# Patient Record
Sex: Male | Born: 1975 | Race: White | Hispanic: No | Marital: Single | Smoking: Never smoker
Health system: Southern US, Community
[De-identification: ages and names within clinical notes are randomized; demographics above are authoritative.]

## PROBLEM LIST (undated history)

## (undated) DIAGNOSIS — I219 Acute myocardial infarction, unspecified: Secondary | ICD-10-CM

## (undated) DIAGNOSIS — J189 Pneumonia, unspecified organism: Secondary | ICD-10-CM

## (undated) DIAGNOSIS — Z9289 Personal history of other medical treatment: Secondary | ICD-10-CM

## (undated) DIAGNOSIS — M303 Mucocutaneous lymph node syndrome [Kawasaki]: Secondary | ICD-10-CM

## (undated) HISTORY — DX: Mucocutaneous lymph node syndrome (kawasaki): M30.3

## (undated) HISTORY — PX: CARDIAC CATHETERIZATION: SHX172

## (undated) HISTORY — PX: CORONARY ARTERY BYPASS GRAFT: SHX141

## (undated) HISTORY — PX: WISDOM TOOTH EXTRACTION: SHX21

---

## 2011-07-23 ENCOUNTER — Emergency Department: Payer: Self-pay | Admitting: Unknown Physician Specialty

## 2011-07-28 ENCOUNTER — Other Ambulatory Visit: Payer: Self-pay | Admitting: Orthopedic Surgery

## 2011-07-28 ENCOUNTER — Encounter (HOSPITAL_COMMUNITY): Payer: Self-pay | Admitting: Pharmacy Technician

## 2011-07-28 ENCOUNTER — Encounter (HOSPITAL_COMMUNITY)
Admission: RE | Admit: 2011-07-28 | Discharge: 2011-07-28 | Disposition: A | Discharge status: Home / Self Care | Payer: Worker's Compensation | Source: Ambulatory Visit | Attending: Orthopedic Surgery | Admitting: Orthopedic Surgery

## 2011-07-28 ENCOUNTER — Encounter (HOSPITAL_COMMUNITY): Payer: Self-pay

## 2011-07-28 ENCOUNTER — Encounter (HOSPITAL_COMMUNITY): Payer: Self-pay | Admitting: Vascular Surgery

## 2011-07-28 HISTORY — DX: Acute myocardial infarction, unspecified: I21.9

## 2011-07-28 HISTORY — DX: Pneumonia, unspecified organism: J18.9

## 2011-07-28 HISTORY — DX: Personal history of other medical treatment: Z92.89

## 2011-07-28 LAB — SURGICAL PCR SCREEN
MRSA, PCR: NEGATIVE
Staphylococcus aureus: POSITIVE — AB

## 2011-07-28 LAB — CBC
Hemoglobin: 19.1 g/dL — ABNORMAL HIGH (ref 13.0–17.0)
MCHC: 34.3 g/dL (ref 30.0–36.0)
Platelets: 247 10*3/uL (ref 150–400)
RDW: 14 % (ref 11.5–15.5)

## 2011-07-28 MED ORDER — CEFAZOLIN SODIUM-DEXTROSE 2-3 GM-% IV SOLR
2.0000 g | INTRAVENOUS | Status: DC
  Filled 2011-07-28: qty 50

## 2011-07-28 NOTE — Consult Note (Signed)
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  He has since developed compartment syndrome and is scheduled for left hand compartment releases on 07/29/11.  His PAT visit was on the afternoon of 07/28/11.  His Cardiologist is Dr. Merryl Hacker at [facility].  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  He has somewhat chronic chest pain, and subsequently had a cardiac cath on 06/11/11.  Patient feels at his baseline.  He reports that no new intervention or diagnostic study was recommended following his cardiac cath.  His last Cardiology office note or discharge summary has not been received yet, but [facility] did fax his recent cardiac cath report.  I only see mention of two bypass grafts (PDA and distal LAD) which were both noted to be patent.  EF 63%.  His EKG from [facility] is not dated, nor confirmed.  I reviewed his history and cath report with Anesthesiologist Dr. Jacklynn Bue.    CBC noted.  His H/H is 19.1/55.7. He is a non-smoker and is on ASA.  There are no comparison labs available. He will need a BMET and repeat EKG on arrival.  Shonna Chock, PA-C

## 2011-07-28 NOTE — Pre-Procedure Instructions (Signed)
18 Ricky Morrison  07/28/2011   Your procedure is scheduled on:  Wednesday, June 19th.  Report to [facility] at 7:00 AM.  Call this number if you have problems the morning of surgery: [phone]   Remember:   Do not eat food or drink any liquid:After Midnight.     Take these medicines the morning of surgery with A SIP OF WATER:    Do not wear jewelry, make-up or nail polish.  Do not wear lotions, powders, or perfumes. You may wear deodorant.  Do not shave 48 hours prior to surgery. Men may shave face and neck.  Do not bring valuables to the hospital.  Contacts, dentures or bridgework may not be worn into surgery.  Leave suitcase in the car. After surgery it may be brought to your room.  For patients admitted to the hospital, checkout time is 11:00 AM the day of discharge.   Patients discharged the day of surgery will not be allowed to drive home.  Name and phone number of your driver: _______________________  Special Instructions: CHG Shower Use Special Wash: 1/2 bottle night before surgery and 1/2 bottle morning of surgery.   Please read over the following fact sheets that you were given: Pain Booklet, Coughing and Deep Breathing, MRSA Information and Surgical Site Infection Prevention

## 2011-07-28 NOTE — Progress Notes (Signed)
After numerous attempts to fax request for information to [facility], I was able to speak to a nurse, Roxann.  Roxann gave me her fax number and request was made and she called back to say information was on its way.

## 2011-07-29 ENCOUNTER — Encounter (HOSPITAL_COMMUNITY)
Admission: RE | Disposition: A | Discharge status: Home / Self Care | Payer: Self-pay | Source: Ambulatory Visit | Attending: Orthopedic Surgery

## 2011-07-29 ENCOUNTER — Ambulatory Visit (HOSPITAL_COMMUNITY): Payer: Worker's Compensation | Admitting: Vascular Surgery

## 2011-07-29 ENCOUNTER — Encounter (HOSPITAL_COMMUNITY): Payer: Self-pay | Admitting: Vascular Surgery

## 2011-07-29 ENCOUNTER — Ambulatory Visit (HOSPITAL_COMMUNITY)
Admission: RE | Admit: 2011-07-29 | Discharge: 2011-07-29 | Disposition: A | Discharge status: Home / Self Care | Payer: Worker's Compensation | Source: Ambulatory Visit | Attending: Orthopedic Surgery | Admitting: Orthopedic Surgery

## 2011-07-29 DIAGNOSIS — S6720XA Crushing injury of unspecified hand, initial encounter: Secondary | ICD-10-CM

## 2011-07-29 DIAGNOSIS — I252 Old myocardial infarction: Secondary | ICD-10-CM | POA: Insufficient documentation

## 2011-07-29 DIAGNOSIS — S6710XA Crushing injury of unspecified finger(s), initial encounter: Secondary | ICD-10-CM | POA: Insufficient documentation

## 2011-07-29 DIAGNOSIS — X58XXXA Exposure to other specified factors, initial encounter: Secondary | ICD-10-CM | POA: Insufficient documentation

## 2011-07-29 DIAGNOSIS — Z951 Presence of aortocoronary bypass graft: Secondary | ICD-10-CM | POA: Insufficient documentation

## 2011-07-29 DIAGNOSIS — Z01812 Encounter for preprocedural laboratory examination: Secondary | ICD-10-CM | POA: Insufficient documentation

## 2011-07-29 HISTORY — PX: FASCIOTOMY: SHX132

## 2011-07-29 LAB — BASIC METABOLIC PANEL
CO2: 24 mEq/L (ref 19–32)
Chloride: 104 mEq/L (ref 96–112)
Creatinine, Ser: 1.09 mg/dL (ref 0.50–1.35)
GFR calc Af Amer: >90 mL/min (ref >90)
Potassium: 4 mEq/L (ref 3.5–5.1)

## 2011-07-29 SURGERY — FASCIOTOMY, UPPER EXTREMITY
Anesthesia: General | Site: Hand | Laterality: Left | Wound class: Clean

## 2011-07-29 MED ORDER — MIDAZOLAM HCL 2 MG/2ML IJ SOLN: 0.5000 mg | Freq: Once | INTRAMUSCULAR | Status: DC | PRN

## 2011-07-29 MED ORDER — PROMETHAZINE HCL 25 MG/ML IJ SOLN: 6.2500 mg | INTRAMUSCULAR | Status: DC | PRN

## 2011-07-29 MED ORDER — PROPOFOL 10 MG/ML IV BOLUS
INTRAVENOUS | Status: DC | PRN
  Administered 2011-07-29: 200 mg via INTRAVENOUS

## 2011-07-29 MED ORDER — ONDANSETRON HCL 4 MG/2ML IJ SOLN
INTRAMUSCULAR | Status: DC | PRN
  Administered 2011-07-29: 4 mg via INTRAVENOUS

## 2011-07-29 MED ORDER — CHLORHEXIDINE GLUCONATE 4 % EX LIQD: 60.0000 mL | Freq: Once | CUTANEOUS | Status: DC

## 2011-07-29 MED ORDER — OXYCODONE-ACETAMINOPHEN 5-325 MG PO TABS: 1.0000 | ORAL_TABLET | ORAL | Status: AC | PRN

## 2011-07-29 MED ORDER — BUPIVACAINE HCL (PF) 0.25 % IJ SOLN
INTRAMUSCULAR | Status: DC | PRN
  Administered 2011-07-29: 10 mL

## 2011-07-29 MED ORDER — HYDROMORPHONE HCL PF 1 MG/ML IJ SOLN
0.2500 mg | INTRAMUSCULAR | Status: DC | PRN
  Administered 2011-07-29 (×4): 0.5 mg via INTRAVENOUS

## 2011-07-29 MED ORDER — LACTATED RINGERS IV SOLN
INTRAVENOUS | Status: DC | PRN
  Administered 2011-07-29: 09:00:00 via INTRAVENOUS

## 2011-07-29 MED ORDER — MUPIROCIN 2 % EX OINT
TOPICAL_OINTMENT | CUTANEOUS | Status: AC
  Filled 2011-07-29: qty 22

## 2011-07-29 MED ORDER — MIDAZOLAM HCL 5 MG/5ML IJ SOLN
INTRAMUSCULAR | Status: DC | PRN
  Administered 2011-07-29: 2 mg via INTRAVENOUS

## 2011-07-29 MED ORDER — MEPERIDINE HCL 25 MG/ML IJ SOLN: 6.2500 mg | INTRAMUSCULAR | Status: DC | PRN

## 2011-07-29 MED ORDER — HYDROMORPHONE HCL PF 1 MG/ML IJ SOLN
INTRAMUSCULAR | Status: AC
  Filled 2011-07-29: qty 1

## 2011-07-29 MED ORDER — MUPIROCIN 2 % EX OINT: TOPICAL_OINTMENT | Freq: Two times a day (BID) | CUTANEOUS | Status: DC

## 2011-07-29 MED ORDER — CEFAZOLIN SODIUM 1-5 GM-% IV SOLN
INTRAVENOUS | Status: DC | PRN
  Administered 2011-07-29: 1 g via INTRAVENOUS

## 2011-07-29 MED ORDER — LIDOCAINE HCL (CARDIAC) 20 MG/ML IV SOLN
INTRAVENOUS | Status: DC | PRN
  Administered 2011-07-29: 40 mg via INTRAVENOUS

## 2011-07-29 MED ORDER — BUPIVACAINE HCL (PF) 0.25 % IJ SOLN
INTRAMUSCULAR | Status: AC
  Filled 2011-07-29: qty 30

## 2011-07-29 MED ORDER — FENTANYL CITRATE 0.05 MG/ML IJ SOLN
INTRAMUSCULAR | Status: DC | PRN
  Administered 2011-07-29 (×2): 50 ug via INTRAVENOUS

## 2011-07-29 MED ORDER — SODIUM CHLORIDE 0.9 % IR SOLN
Status: DC | PRN
  Administered 2011-07-29: 1

## 2011-07-29 SURGICAL SUPPLY — 51 items
BANDAGE ELASTIC 3 VELCRO ST LF (GAUZE/BANDAGES/DRESSINGS) ×2 IMPLANT
BANDAGE ELASTIC 4 VELCRO ST LF (GAUZE/BANDAGES/DRESSINGS) ×2 IMPLANT
BANDAGE GAUZE ELAST BULKY 4 IN (GAUZE/BANDAGES/DRESSINGS) ×3 IMPLANT
BNDG CMPR 9X4 STRL LF SNTH (GAUZE/BANDAGES/DRESSINGS)
BNDG ESMARK 4X9 LF (GAUZE/BANDAGES/DRESSINGS) IMPLANT
CLOTH BEACON ORANGE TIMEOUT ST (SAFETY) ×2 IMPLANT
CORDS BIPOLAR (ELECTRODE) ×1 IMPLANT
COVER SURGICAL LIGHT HANDLE (MISCELLANEOUS) ×3 IMPLANT
CUFF TOURNIQUET SINGLE 18IN (TOURNIQUET CUFF) ×2 IMPLANT
DECANTER SPIKE VIAL GLASS SM (MISCELLANEOUS) ×2 IMPLANT
DRAIN PENROSE 1/4X12 LTX STRL (WOUND CARE) IMPLANT
DRAPE SURG 17X23 STRL (DRAPES) ×2 IMPLANT
DRSG PAD ABDOMINAL 8X10 ST (GAUZE/BANDAGES/DRESSINGS) ×4 IMPLANT
DURAPREP 26ML APPLICATOR (WOUND CARE) ×1 IMPLANT
ELECT REM PT RETURN 9FT ADLT (ELECTROSURGICAL) ×2
ELECTRODE REM PT RTRN 9FT ADLT (ELECTROSURGICAL) IMPLANT
GAUZE PACKING IODOFORM 1/4X5 (PACKING) IMPLANT
GAUZE XEROFORM 1X8 LF (GAUZE/BANDAGES/DRESSINGS) ×2 IMPLANT
GLOVE BIO SURGEON STRL SZ8.5 (GLOVE) ×2 IMPLANT
GOWN SRG XL XLNG 56XLVL 4 (GOWN DISPOSABLE) ×1 IMPLANT
GOWN STRL NON-REIN LRG LVL3 (GOWN DISPOSABLE) ×2 IMPLANT
GOWN STRL NON-REIN XL XLG LVL4 (GOWN DISPOSABLE) ×2
HANDPIECE INTERPULSE COAX TIP (DISPOSABLE)
KIT BASIN OR (CUSTOM PROCEDURE TRAY) ×2 IMPLANT
KIT ROOM TURNOVER OR (KITS) ×1 IMPLANT
MANIFOLD NEPTUNE II (INSTRUMENTS) ×2 IMPLANT
NDL HYPO 25GX1X1/2 BEV (NEEDLE) IMPLANT
NDL HYPO 25X1 1.5 SAFETY (NEEDLE) ×1 IMPLANT
NEEDLE HYPO 25GX1X1/2 BEV (NEEDLE) ×2 IMPLANT
NEEDLE HYPO 25X1 1.5 SAFETY (NEEDLE) IMPLANT
NS IRRIG 1000ML POUR BTL (IV SOLUTION) ×2 IMPLANT
PACK ORTHO EXTREMITY (CUSTOM PROCEDURE TRAY) ×2 IMPLANT
PAD ARMBOARD 7.5X6 YLW CONV (MISCELLANEOUS) ×4 IMPLANT
PAD CAST 4YDX4 CTTN HI CHSV (CAST SUPPLIES) ×2 IMPLANT
PADDING CAST COTTON 4X4 STRL (CAST SUPPLIES) ×2
SET HNDPC FAN SPRY TIP SCT (DISPOSABLE) IMPLANT
SPLINT PLASTER EXTRA FAST 3X15 (CAST SUPPLIES) ×1
SPLINT PLASTER GYPS XFAST 3X15 (CAST SUPPLIES) IMPLANT
SPONGE GAUZE 4X4 12PLY (GAUZE/BANDAGES/DRESSINGS) ×4 IMPLANT
SPONGE LAP 18X18 X RAY DECT (DISPOSABLE) ×1 IMPLANT
SUCTION FRAZIER TIP 10 FR DISP (SUCTIONS) ×2 IMPLANT
SUT ETHILON 4 0 PS 2 18 (SUTURE) ×1 IMPLANT
SYR 20CC LL (SYRINGE) IMPLANT
SYR CONTROL 10ML LL (SYRINGE) ×2 IMPLANT
TOWEL OR 17X24 6PK STRL BLUE (TOWEL DISPOSABLE) ×2 IMPLANT
TOWEL OR 17X26 10 PK STRL BLUE (TOWEL DISPOSABLE) ×2 IMPLANT
TUBE ANAEROBIC SPECIMEN COL (MISCELLANEOUS) IMPLANT
TUBE CONNECTING 12X1/4 (SUCTIONS) ×2 IMPLANT
UNDERPAD 30X30 INCONTINENT (UNDERPADS AND DIAPERS) ×2 IMPLANT
WATER STERILE IRR 1000ML POUR (IV SOLUTION) ×1 IMPLANT
YANKAUER SUCT BULB TIP NO VENT (SUCTIONS) ×1 IMPLANT

## 2011-07-29 NOTE — Op Note (Signed)
See note [phone]

## 2011-07-29 NOTE — Op Note (Signed)
NAMEKENRICK, PORE            ACCOUNT NO.:  [id number]  MEDICAL RECORD NO.:  [id number]  LOCATION:  [facility]                         FACILITY:  [facility]  PHYSICIAN:  Artist Pais. Onelia Cadmus, M.D.DATE OF BIRTH:  [birth year]  DATE OF PROCEDURE:  07/29/2011 DATE OF DISCHARGE:                              OPERATIVE REPORT   PREOPERATIVE DIAGNOSIS:  Crush injury, left hand.  POSTOPERATIVE DIAGNOSIS:  Crush injury, left hand.  PROCEDURE:  Compartment release, left hand.  SURGEON:  Artist Pais. Mina Marble, MD  ASSISTANT:  None.  ANESTHESIA:  General.  COMPLICATIONS:  No complications.  DRAINS:  No drains.  The patient was taken to the operating suite.  After induction of adequate general anesthesia, left upper extremity was prepped and draped in sterile fashion.  An Esmarch was used to exsanguinate the limb. Tourniquet was inflated to 265 mmHg.  At this point in time, incision was made between the index and long metacarpal in between the ring and small metacarpal dorsally 4 to 5 cm skin was incised.  Extensor tendons were retracted.  The fascia overlying interosseous muscles on either side of the metacarpals were split.  The muscle was viable, but under pressure the wound was thoroughly irrigated.  Bleeding was treated with electrocautery.  The wounds were loosely closed with 4-0 nylon. Xeroform, 4 x 4s, and a splint was applied with the wrist slightly extended, MP joints flexed, PIP joints extended.  The patient tolerated the procedure well and went to the recovery room in a stable fashion.     Artist Pais Mina Marble, M.D.     MAW/MEDQ  D:  07/29/2011  T:  07/29/2011  Job:  [id number]

## 2011-07-29 NOTE — Anesthesia Preprocedure Evaluation (Addendum)
Anesthesia Evaluation  Patient identified by MRN, date of birth, ID band Patient awake    Reviewed: Allergy & Precautions, H&P , NPO status , Patient's Chart, lab work & pertinent test results  History of Anesthesia Complications Negative for: history of anesthetic complications  Airway Mallampati: II TM Distance: >3 FB Neck ROM: Full    Dental No notable dental hx. (+) Teeth Intact and Dental Advisory Given   Pulmonary neg pulmonary ROS,  breath sounds clear to auscultation  Pulmonary exam normal       Cardiovascular + Past MI and + CABG (age 36, age 33 CABG at [facility], cath 5/13 showing both grafts patent, EF 63%) Rhythm:Regular Rate:Normal     Neuro/Psych negative neurological ROS  negative psych ROS   GI/Hepatic negative GI ROS, Neg liver ROS,   Endo/Other    Renal/GU negative Renal ROS  negative genitourinary   Musculoskeletal negative musculoskeletal ROS (+)   Abdominal   Peds  Hematology negative hematology ROS (+)   Anesthesia Other Findings   Reproductive/Obstetrics negative OB ROS                          Anesthesia Physical Anesthesia Plan  ASA: III  Anesthesia Plan: General   Post-op Pain Management:    Induction: Intravenous  Airway Management Planned: LMA  Additional Equipment:   Intra-op Plan:   Post-operative Plan:   Informed Consent: I have reviewed the patients History and Physical, chart, labs and discussed the procedure including the risks, benefits and alternatives for the proposed anesthesia with the patient or authorized representative who has indicated his/her understanding and acceptance.   Dental advisory given  Plan Discussed with: Surgeon and CRNA  Anesthesia Plan Comments: (Plan routine monitors, GA- LMA OK)        Anesthesia Quick Evaluation

## 2011-07-29 NOTE — Transfer of Care (Signed)
Immediate Anesthesia Transfer of Care Note  Patient: Ricky Morrison  Procedure(s) Performed: Procedure(s) (LRB): FASCIOTOMY (Left)  Patient Location: PACU  Anesthesia Type: General  Level of Consciousness: awake, alert  and oriented  Airway & Oxygen Therapy: Patient Spontanous Breathing and Patient connected to nasal cannula oxygen  Post-op Assessment: Report given to PACU RN and Post -op Vital signs reviewed and stable  Post vital signs: Reviewed and stable  Complications: No apparent anesthesia complications

## 2011-07-29 NOTE — Preoperative (Signed)
Beta Blockers   Reason not to administer Beta Blockers:Not Applicable 

## 2011-07-29 NOTE — Anesthesia Postprocedure Evaluation (Signed)
  Anesthesia Post-op Note  Patient: Ricky Morrison  Procedure(s) Performed: Procedure(s) (LRB): FASCIOTOMY (Left)  Patient Location: PACU  Anesthesia Type: General  Level of Consciousness: awake, alert  and oriented  Airway and Oxygen Therapy: Patient Spontanous Breathing  Post-op Pain: mild  Post-op Assessment: Post-op Vital signs reviewed, Patient's Cardiovascular Status Stable, Respiratory Function Stable, Patent Airway, No signs of Nausea or vomiting and Pain level controlled  Post-op Vital Signs: Reviewed and stable  Complications: No apparent anesthesia complications

## 2011-07-29 NOTE — Brief Op Note (Signed)
07/29/2011  9:30 AM  PATIENT:  Ricky Morrison  36 y.o. male  PRE-OPERATIVE DIAGNOSIS:  LEFT HAND CRUSH INJURY  POST-OPERATIVE DIAGNOSIS:  * No post-op diagnosis entered *  PROCEDURE:  Procedure(s) (LRB): FASCIOTOMY (Left)  SURGEON:  Surgeon(s) and Role:    * Marlowe Shores, MD - Primary  PHYSICIAN ASSISTANT:   ASSISTANTS: none   ANESTHESIA:   general  EBL:  Total I/O In: 500 [I.V.:500] Out: -   BLOOD ADMINISTERED:none  DRAINS: none   LOCAL MEDICATIONS USED:  MARCAINE  10cc  SPECIMEN:  No Specimen  DISPOSITION OF SPECIMEN:  N/A  COUNTS:  YES  TOURNIQUET:   Total Tourniquet Time Documented: Upper Arm (Left) - 17 minutes  DICTATION: .Other Dictation: Dictation Number [id number]  PLAN OF CARE: Discharge to home after PACU  PATIENT DISPOSITION:  PACU - hemodynamically stable.   Delay start of Pharmacological VTE agent (>24hrs) due to surgical blood loss or risk of bleeding: not applicable

## 2011-07-29 NOTE — H&P (Signed)
Ricky Morrison is an 36 y.o. male.   Chief Complaint: left hand swelling  HPI: as above s/p crush injury to left hand with pain on passive extension of digits  Past Medical History  Diagnosis Date  . Pneumonia     as a child  . History of blood transfusion     age 35  . Myocardial infarction     1 at age of [age]..  Cardiologist : Dr Merryl Hacker, [facility].  Varney Baas disease     Dr. Merryl Hacker ([facility])    Past Surgical History  Procedure Date  . Cardiac catheterization     May 2013  . Wisdom tooth extraction   . Coronary artery bypass graft     CABG x 2-  5 vessels    Family History  Problem Relation Age of Onset  . Other Neg Hx    Social History:  reports that he has never smoked. He does not have any smokeless tobacco history on file. He reports that he does not drink alcohol or use illicit drugs.  Allergies:  Allergies  Allergen Reactions  . Codeine Sulfate Other (See Comments)    Halluinations  . Penicillins     Unsure of reaction    Medications Prior to Admission  Medication Sig Dispense Refill  . aspirin 325 MG EC tablet Take 325 mg by mouth daily.      . diphenhydramine-acetaminophen (TYLENOL PM) 25-500 MG TABS Take 2 tablets by mouth at bedtime as needed. For sleep.      Marland Kitchen ibuprofen (ADVIL,MOTRIN) 200 MG tablet Take 400 mg by mouth every 6 (six) hours as needed. For pain.        Results for orders placed during the hospital encounter of 07/28/11 (from the past 48 hour(s))  SURGICAL PCR SCREEN     Status: Abnormal   Collection Time   07/28/11  2:41 PM      Component Value Range Comment   MRSA, PCR NEGATIVE  NEGATIVE    Staphylococcus aureus POSITIVE (*) NEGATIVE   CBC     Status: Abnormal   Collection Time   07/28/11  2:56 PM      Component Value Range Comment   WBC 7.0  4.0 - 10.5 K/uL    RBC 6.50 (*) 4.22 - 5.81 MIL/uL    Hemoglobin 19.1 (*) 13.0 - 17.0 g/dL    HCT 16.1 (*) 09.6 - 52.0 %    MCV 85.7  78.0 - 100.0 fL    MCH 29.4   26.0 - 34.0 pg    MCHC 34.3  30.0 - 36.0 g/dL    RDW 04.5  40.9 - 81.1 %    Platelets 247  150 - 400 K/uL    No results found.  Review of Systems  All other systems reviewed and are negative.    Blood pressure 124/79, pulse 66, temperature 98.4 F (36.9 C), temperature source Oral, resp. rate 18, SpO2 97.00%. Physical Exam  Constitutional: He is oriented to person, place, and time. He appears well-developed and well-nourished.  HENT:  Head: Normocephalic and atraumatic.  Cardiovascular: Normal rate.   Respiratory: Effort normal.  Musculoskeletal:       Left hand: He exhibits tenderness and swelling.  Neurological: He is alert and oriented to person, place, and time.  Skin: Skin is warm.  Psychiatric: He has a normal mood and affect. His speech is normal and behavior is normal. Thought content normal.     Assessment/Plan As  above  Plan compartment releases  Kagan Mutchler A 07/29/2011, 8:45 AM

## 2011-07-31 ENCOUNTER — Encounter (HOSPITAL_COMMUNITY): Payer: Self-pay | Admitting: Orthopedic Surgery

## 2020-01-02 ENCOUNTER — Other Ambulatory Visit: Payer: Self-pay

## 2020-01-02 ENCOUNTER — Emergency Department
Admission: EM | Admit: 2020-01-02 | Discharge: 2020-01-02 | Disposition: A | Discharge status: Home / Self Care | Payer: Worker's Compensation | Attending: Emergency Medicine | Admitting: Emergency Medicine

## 2020-01-02 ENCOUNTER — Emergency Department: Payer: Worker's Compensation

## 2020-01-02 ENCOUNTER — Encounter: Payer: Self-pay | Admitting: Emergency Medicine

## 2020-01-02 DIAGNOSIS — S62663A Nondisplaced fracture of distal phalanx of left middle finger, initial encounter for closed fracture: Secondary | ICD-10-CM | POA: Insufficient documentation

## 2020-01-02 DIAGNOSIS — S6992XA Unspecified injury of left wrist, hand and finger(s), initial encounter: Secondary | ICD-10-CM | POA: Diagnosis present

## 2020-01-02 DIAGNOSIS — S60032A Contusion of left middle finger without damage to nail, initial encounter: Secondary | ICD-10-CM | POA: Diagnosis not present

## 2020-01-02 DIAGNOSIS — Z7982 Long term (current) use of aspirin: Secondary | ICD-10-CM | POA: Diagnosis not present

## 2020-01-02 DIAGNOSIS — W208XXA Other cause of strike by thrown, projected or falling object, initial encounter: Secondary | ICD-10-CM | POA: Insufficient documentation

## 2020-01-02 DIAGNOSIS — S6010XA Contusion of unspecified finger with damage to nail, initial encounter: Secondary | ICD-10-CM

## 2020-01-02 DIAGNOSIS — S62639A Displaced fracture of distal phalanx of unspecified finger, initial encounter for closed fracture: Secondary | ICD-10-CM

## 2020-01-02 LAB — URINE DRUG SCREEN, QUALITATIVE (ARMC ONLY)
Amphetamines, Ur Screen: NOT DETECTED
Barbiturates, Ur Screen: NOT DETECTED
Benzodiazepine, Ur Scrn: NOT DETECTED
Cannabinoid 50 Ng, Ur ~~LOC~~: NOT DETECTED
Cocaine Metabolite,Ur ~~LOC~~: NOT DETECTED
MDMA (Ecstasy)Ur Screen: NOT DETECTED
Methadone Scn, Ur: NOT DETECTED
Opiate, Ur Screen: NOT DETECTED
Phencyclidine (PCP) Ur S: NOT DETECTED
Tricyclic, Ur Screen: NOT DETECTED

## 2020-01-02 MED ORDER — TRAMADOL HCL 50 MG PO TABS: 50.0000 mg | ORAL_TABLET | Freq: Four times a day (QID) | ORAL | 0 refills | Status: AC | PRN

## 2020-01-02 MED ORDER — LIDOCAINE HCL (PF) 1 % IJ SOLN
5.0000 mL | Freq: Once | INTRAMUSCULAR | Status: DC
  Filled 2020-01-02: qty 5

## 2020-01-02 MED ORDER — SULFAMETHOXAZOLE-TRIMETHOPRIM 800-160 MG PO TABS: 1.0000 | ORAL_TABLET | Freq: Two times a day (BID) | ORAL | 0 refills | Status: AC

## 2020-01-02 MED ORDER — NAPROXEN 500 MG PO TABS: 500.0000 mg | ORAL_TABLET | Freq: Two times a day (BID) | ORAL | 0 refills | Status: AC

## 2020-01-02 NOTE — ED Triage Notes (Signed)
Pt reports dropped a piece of pipe on his left hand middle finger prior to arrival.

## 2020-01-02 NOTE — Discharge Instructions (Signed)
Follow discharge care instruction take medication as directed.  Advised to follow home station orthopedics.

## 2020-01-02 NOTE — ED Provider Notes (Signed)
[facility] Emergency Department Provider Note   ____________________________________________   First MD Initiated Contact with Patient 01/02/20 1153     (approximate)  I have reviewed the triage vital signs and the nursing notes.   HISTORY  Chief Complaint Finger Injury    HPI Ricky Morrison is a 44 y.o. male patient presents with contusion to the third digit left hand.  Patient a piece of pipe fell on his finger prior to arrival.  Patient denies loss sensation or loss of function.  To the work-related injury.  Patient rates his pain as a 4/10.  Patient described the pain as "sore".  No palliative measures prior to arrival.         Past Medical History:  Diagnosis Date  . History of blood transfusion    age 13  . Kawasaki disease (HCC)    Dr. Merryl Hacker ([facility])  . Myocardial infarction (HCC)    1 at age of [age]..  Cardiologist : Dr Merryl Hacker, [facility].  . Pneumonia    as a child    There are no problems to display for this patient.   Past Surgical History:  Procedure Laterality Date  . CARDIAC CATHETERIZATION     May 2013  . CORONARY ARTERY BYPASS GRAFT     CABG x 2-  5 vessels  . FASCIOTOMY  07/29/2011   Procedure: FASCIOTOMY;  Surgeon: Marlowe Shores, MD;  Location: [facility] OR;  Service: Orthopedics;  Laterality: Left;  . WISDOM TOOTH EXTRACTION      Prior to Admission medications   Medication Sig Start Date End Date Taking? Authorizing Provider  aspirin 325 MG EC tablet Take 325 mg by mouth daily.    [provider]  diphenhydramine-acetaminophen (TYLENOL PM) 25-500 MG TABS Take 2 tablets by mouth at bedtime as needed. For sleep.    [provider]  ibuprofen (ADVIL,MOTRIN) 200 MG tablet Take 400 mg by mouth every 6 (six) hours as needed. For pain.    [provider]  naproxen (NAPROSYN) 500 MG tablet Take 1 tablet (500 mg total) by mouth 2 (two) times daily with a meal. 01/02/20    Joni Reining, PA-C  sulfamethoxazole-trimethoprim (BACTRIM DS) 800-160 MG tablet Take 1 tablet by mouth 2 (two) times daily. 01/02/20   Joni Reining, PA-C  traMADol (ULTRAM) 50 MG tablet Take 1 tablet (50 mg total) by mouth every 6 (six) hours as needed for moderate pain. 01/02/20   Joni Reining, PA-C    Allergies Codeine sulfate and Penicillins  Family History  Problem Relation Age of Onset  . Other Neg Hx     Social History Social History   Tobacco Use  . Smoking status: Never Smoker  Substance Use Topics  . Alcohol use: No  . Drug use: No    Review of Systems Constitutional: No fever/chills Eyes: No visual changes. ENT: No sore throat. Cardiovascular: Denies chest pain. Respiratory: Denies shortness of breath. Gastrointestinal: No abdominal pain.  No nausea, no vomiting.  No diarrhea.  No constipation. Genitourinary: Negative for dysuria. Musculoskeletal: Pain and subungual hematoma to the third digit left hand. Skin: Negative for rash. Neurological: Negative for headaches, focal weakness or numbness. Allergic/Immunilogical: Codeine, penicillin, and sulfa. ____________________________________________   PHYSICAL EXAM:  VITAL SIGNS: ED Triage Vitals  Enc Vitals Group     BP 01/02/20 1108 120/77     Pulse Rate 01/02/20 1108 70     Resp 01/02/20 1108 20  Temp 01/02/20 1108 97.8 F (36.6 C)     Temp Source 01/02/20 1108 Oral     SpO2 01/02/20 1108 96 %     Weight 01/02/20 1053 190 lb (86.2 kg)     Height 01/02/20 1053 5\' 10"  (1.778 m)     Head Circumference --      Peak Flow --      Pain Score 01/02/20 1052 4     Pain Loc --      Pain Edu? --      Excl. in GC? --    Constitutional: Alert and oriented. Well appearing and in no acute distress. Cardiovascular: Normal rate, regular rhythm. Grossly normal heart sounds.  Good peripheral circulation. Respiratory: Normal respiratory effort.  No retractions. Lungs CTAB. Musculoskeletal: Mild edema to the  third digit left hand. Neurologic:  Normal speech and language. No gross focal neurologic deficits are appreciated. No gait instability. Skin:  Skin is warm, dry and intact. No rash noted.  Subungual hematoma to third digit left hand.   Psychiatric: Mood and affect are normal. Speech and behavior are normal.  ____________________________________________   LABS (all labs ordered are listed, but only abnormal results are displayed)  Labs Reviewed  URINE DRUG SCREEN, QUALITATIVE ([facility] ONLY)   ____________________________________________  EKG   ____________________________________________  RADIOLOGY I, 01/04/20, personally viewed and evaluated these images (plain radiographs) as part of my medical decision making, as well as reviewing the written report by the radiologist.  ED MD interpretation: Tuft fracture to third digit left hand.  Official radiology report(s): DG Hand 2 View Left  Result Date: 01/02/2020 CLINICAL DATA:  Left hand/middle finger injury. EXAM: LEFT HAND - 2 VIEW COMPARISON:  07/23/2011. FINDINGS: Subtle nondisplaced fracture of the distal tuft of the distal phalanx of the left third digit. No other acute bony abnormality. No evidence of dislocation. No radiopaque foreign body. IMPRESSION: Subtle nondisplaced fracture of the distal tuft of the distal phalanx of the left third digit. Electronically Signed   By: 07/25/2011  Register   On: 01/02/2020 11:28    ____________________________________________   PROCEDURES  Procedure(s) performed (including Critical Care):  11/25/2021Marland KitchenIncision and Drainage  Date/Time: 01/02/2020 2:13 PM Performed by: 01/04/2020, PA-C Authorized by: Joni Reining, PA-C   Procedure type:    Complexity:  Simple Procedure details:    Incision depth:  Subungual   Drainage:  Bloody   Drainage amount:  Scant   Wound treatment:  Wound left open Post-procedure details:    Patient tolerance of procedure:  Tolerated well, no immediate  complications     ____________________________________________   INITIAL IMPRESSION / ASSESSMENT AND PLAN / ED COURSE  As part of my medical decision making, I reviewed the following data within the electronic MEDICAL RECORD NUMBER         Patient presents with a crush injury to the third digit left hand.  See procedure note.  Discussed x-ray findings with tuft fracture.  Patient with subungual hematoma.  See procedure note.  Patient placed in a splint and given discharge care instructions.  Patient to follow-up at home station orthopedics.      ____________________________________________   FINAL CLINICAL IMPRESSION(S) / ED DIAGNOSES  Final diagnoses:  Closed fracture of tuft of distal phalanx of finger  Subungual hematoma of digit of hand, initial encounter     ED Discharge Orders         Ordered    sulfamethoxazole-trimethoprim (BACTRIM DS) 800-160 MG tablet  2  times daily        01/02/20 1411    naproxen (NAPROSYN) 500 MG tablet  2 times daily with meals        01/02/20 1411    traMADol (ULTRAM) 50 MG tablet  Every 6 hours PRN        01/02/20 1411          *Please note:  Ricky Morrison was evaluated in Emergency Department on 01/02/2020 for the symptoms described in the history of present illness. He was evaluated in the context of the global COVID-19 pandemic, which necessitated consideration that the patient might be at risk for infection with the SARS-CoV-2 virus that causes COVID-19. Institutional protocols and algorithms that pertain to the evaluation of patients at risk for COVID-19 are in a state of rapid change based on information released by regulatory bodies including the CDC and federal and state organizations. These policies and algorithms were followed during the patient's care in the ED.  Some ED evaluations and interventions may be delayed as a result of limited staffing during and the pandemic.*   Note:  This document was prepared using Dragon voice  recognition software and may include unintentional dictation errors.    Joni Reining, PA-C 01/02/20 1416    Shaune Pollack, MD 01/02/20 2111

## 2020-01-02 NOTE — ED Notes (Signed)
Patient denies pain and is resting comfortably.  

## 2022-03-14 IMAGING — CR DG HAND 2V*L*
1 series · 2 of 2 positions shown · non-contrast
Comparison: 07/23/2011.

CLINICAL DATA: Left hand/middle finger injury.

EXAM:
LEFT HAND - 2 VIEW

[Series 1: dg hand 2 view left · 0.14mm/px · 2 of 2 slices shown]
[im 1/2]
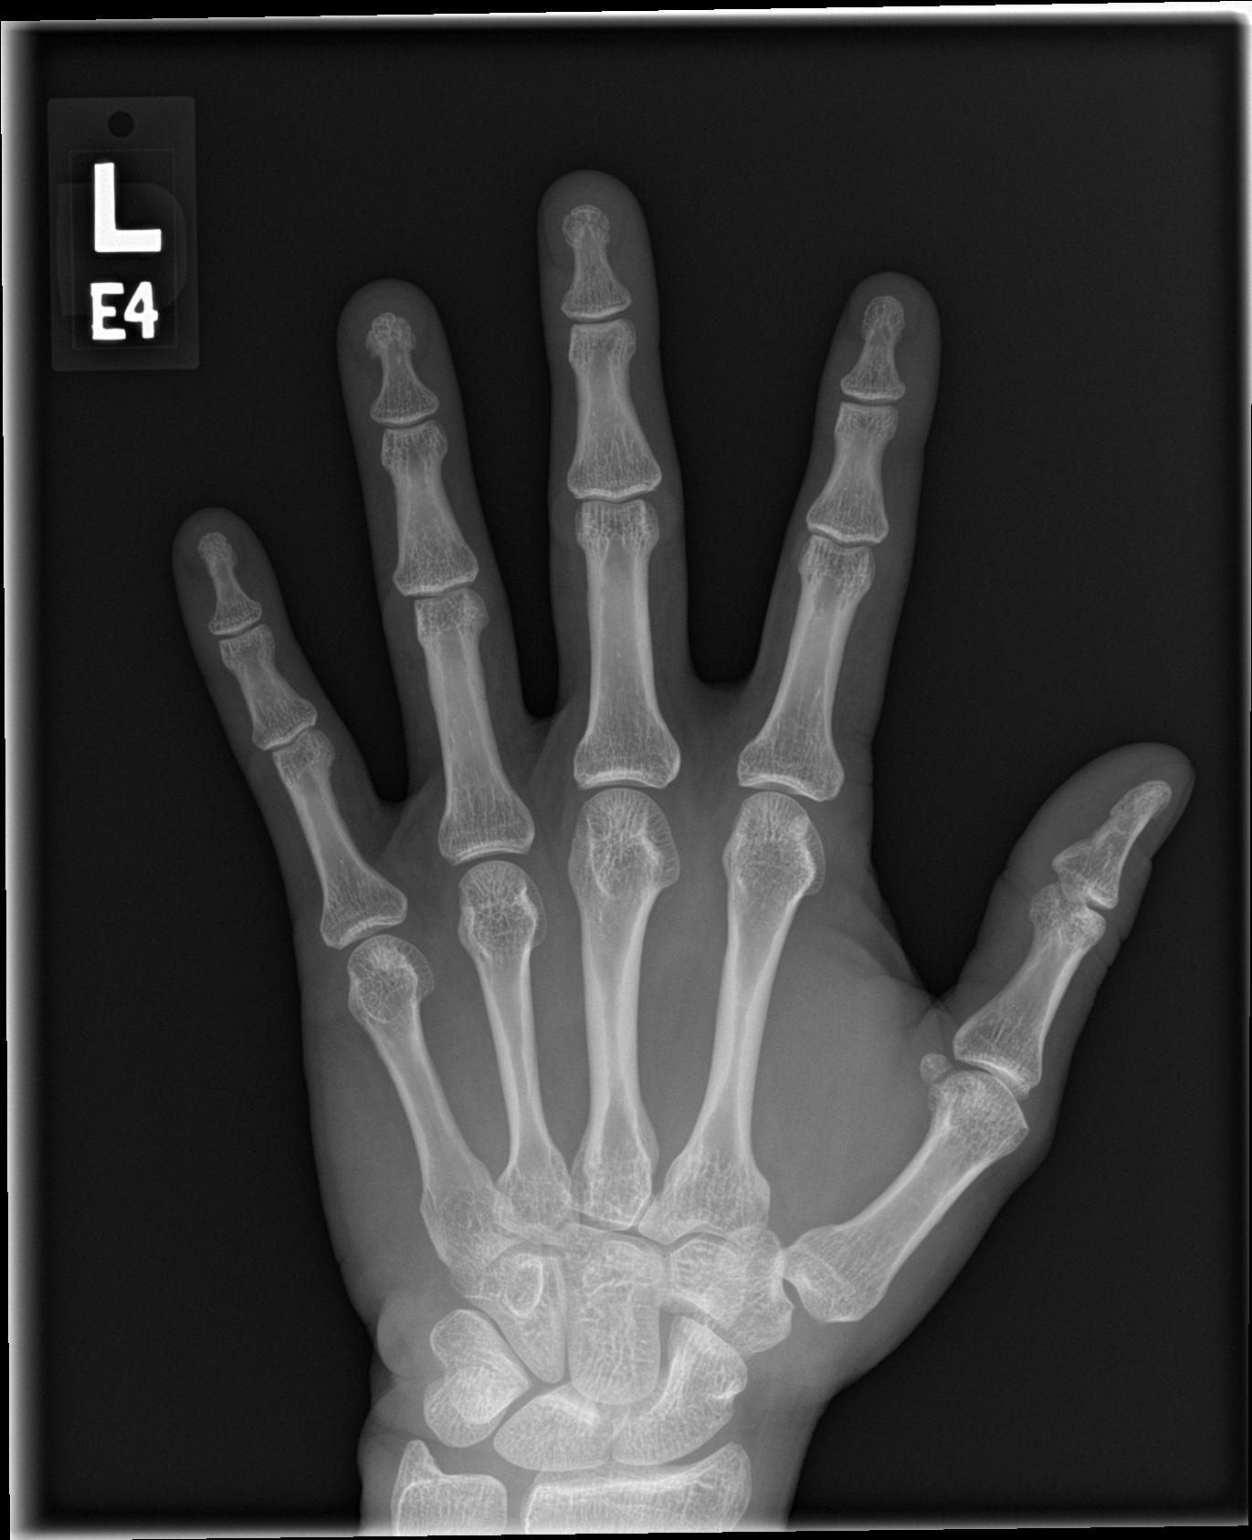
[im 2/2]
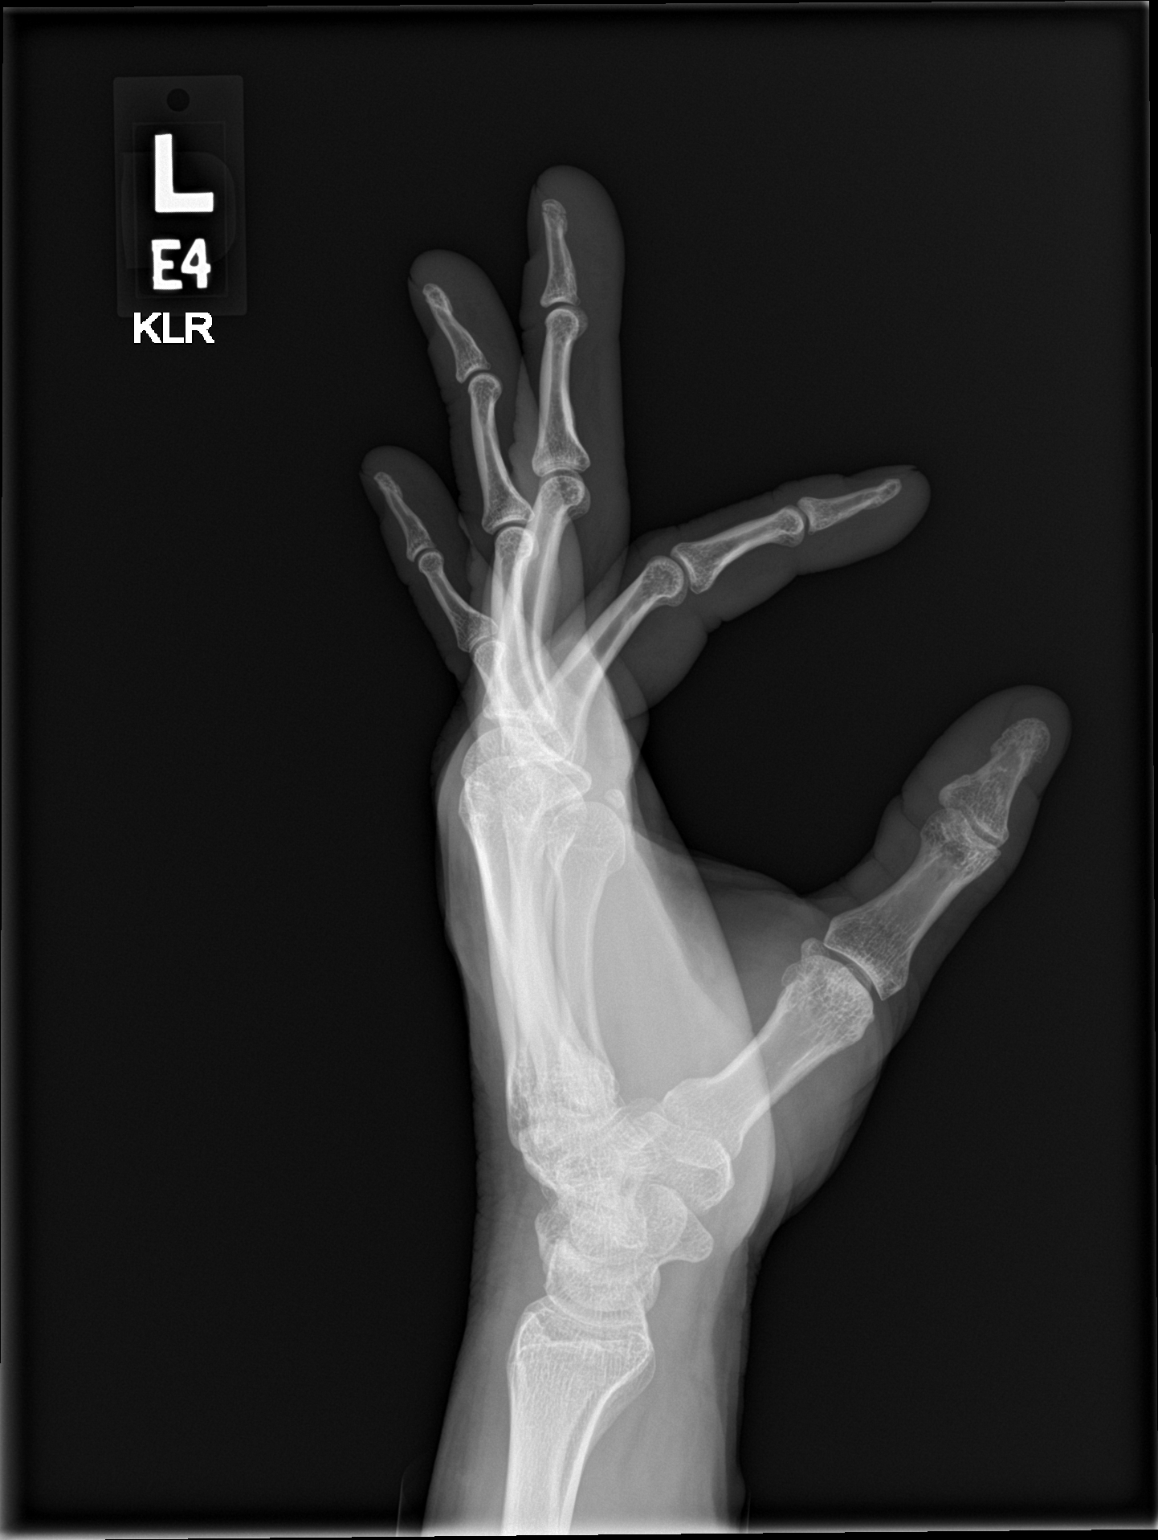

[2 of 2 positions shown; findings below may reference images not displayed]

FINDINGS: Subtle nondisplaced fracture of the distal tuft of the distal
phalanx of the left third digit. No other acute bony abnormality. No
evidence of dislocation. No radiopaque foreign body.
IMPRESSION: Subtle nondisplaced fracture of the distal tuft of the distal
phalanx of the left third digit.
